# Patient Record
Sex: Female | Born: 1985 | Race: Asian | Hispanic: No | Marital: Married | Smoking: Never smoker
Health system: Southern US, Community
[De-identification: ages and names within clinical notes are randomized; demographics above are authoritative.]

---

## 2016-01-14 ENCOUNTER — Ambulatory Visit
Admission: RE | Admit: 2016-01-14 | Discharge: 2016-01-14 | Disposition: A | Discharge status: Home / Self Care | Payer: Self-pay | Source: Ambulatory Visit | Attending: General Practice | Admitting: General Practice

## 2016-01-14 ENCOUNTER — Ambulatory Visit
Admission: RE | Admit: 2016-01-14 | Discharge: 2016-01-14 | Disposition: A | Discharge status: Home / Self Care | Payer: Self-pay | Source: Ambulatory Visit | Attending: Family Medicine | Admitting: Family Medicine

## 2016-01-14 ENCOUNTER — Other Ambulatory Visit: Payer: Self-pay | Admitting: Family Medicine

## 2016-01-14 DIAGNOSIS — Z111 Encounter for screening for respiratory tuberculosis: Secondary | ICD-10-CM

## 2016-01-14 DIAGNOSIS — R7611 Nonspecific reaction to tuberculin skin test without active tuberculosis: Secondary | ICD-10-CM | POA: Insufficient documentation

## 2016-12-07 IMAGING — CR DG CHEST 1V
1 series · 1 of 1 positions shown · non-contrast
Comparison: None.

CLINICAL DATA: Pt had a positive TB test. No smoker, no previous hx

EXAM:
CHEST  1 VIEW

[dg chest 1 view]
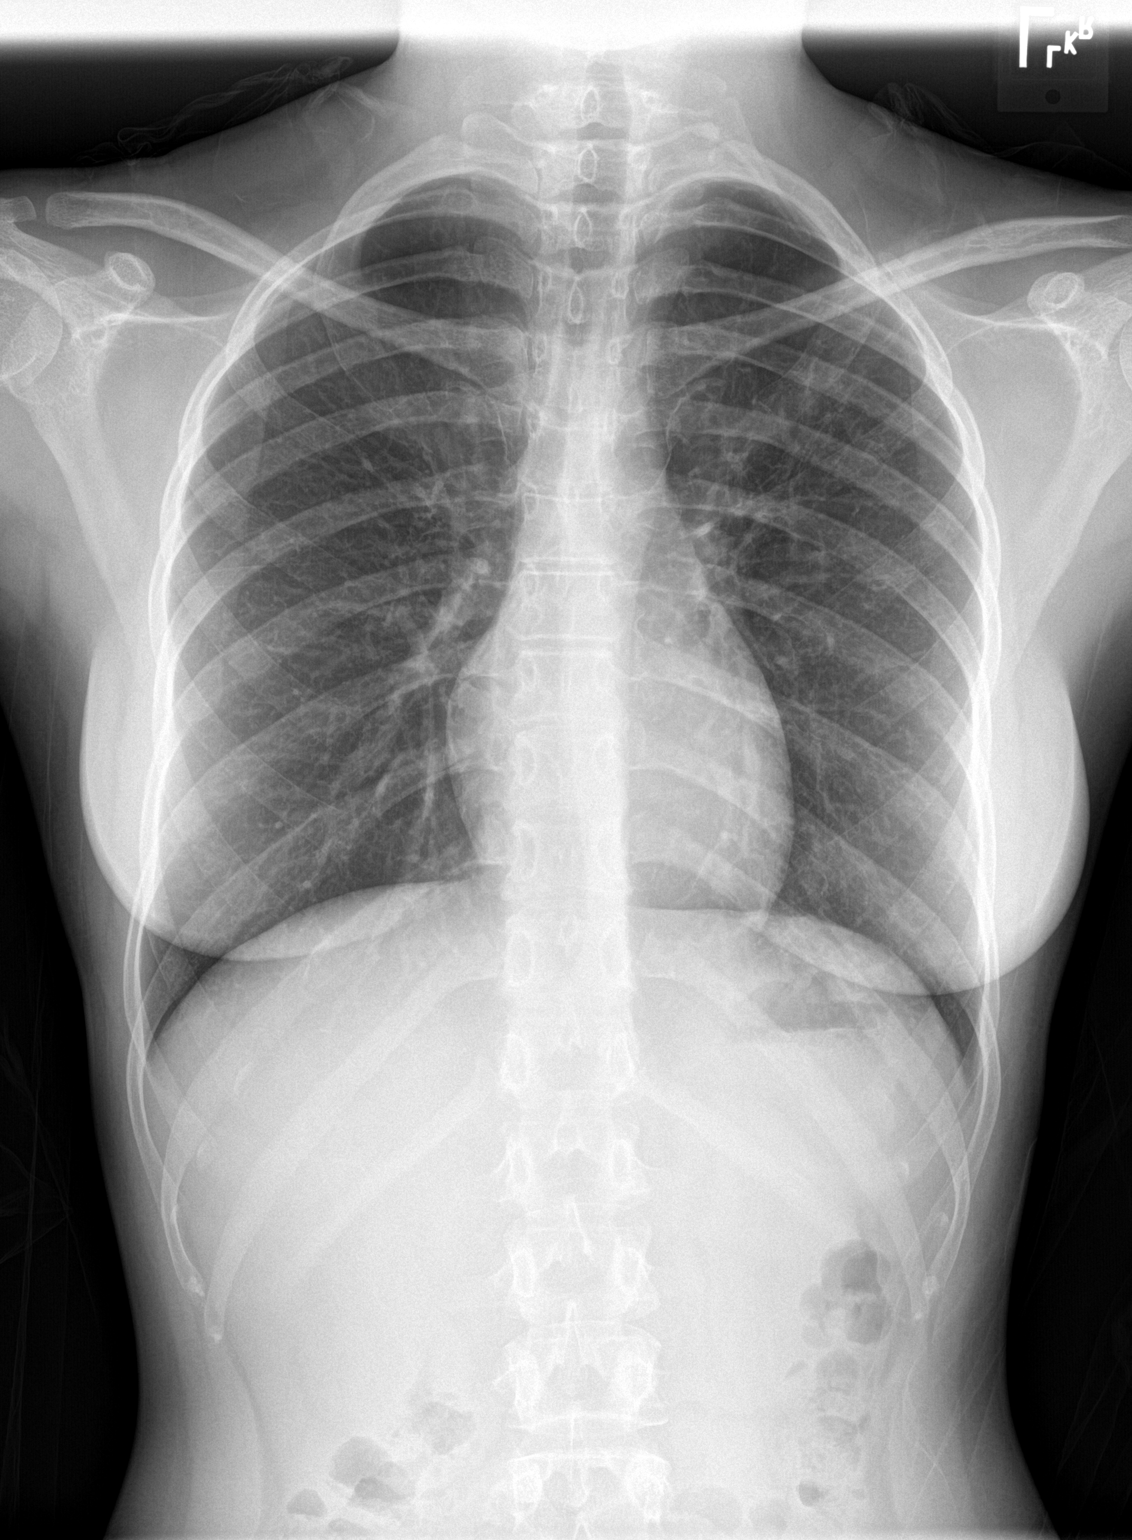

[1 of 1 positions shown; findings below may reference images not displayed]

FINDINGS: Lungs are clear.

Heart size and mediastinal contours are within normal limits.

No effusion.

Visualized bones unremarkable.
IMPRESSION: No acute cardiopulmonary disease.

## 2017-03-22 ENCOUNTER — Ambulatory Visit (INDEPENDENT_AMBULATORY_CARE_PROVIDER_SITE_OTHER): Payer: Self-pay | Admitting: Adult Health

## 2017-03-22 ENCOUNTER — Encounter: Payer: Self-pay | Admitting: Adult Health

## 2017-03-22 VITALS — BP 99/63 | HR 73 | Ht <= 58 in | Wt 90.2 lb

## 2017-03-22 DIAGNOSIS — Z0000 Encounter for general adult medical examination without abnormal findings: Secondary | ICD-10-CM | POA: Insufficient documentation

## 2017-03-22 DIAGNOSIS — J209 Acute bronchitis, unspecified: Secondary | ICD-10-CM

## 2017-03-22 DIAGNOSIS — R5383 Other fatigue: Secondary | ICD-10-CM | POA: Insufficient documentation

## 2017-03-22 MED ORDER — AZITHROMYCIN 250 MG PO TABS
ORAL_TABLET | ORAL | 0 refills | Status: DC
Start: 2017-03-22 — End: 2017-03-28

## 2017-03-22 NOTE — Assessment & Plan Note (Signed)
Azithromycin Increase water/rest/vit c

## 2017-03-22 NOTE — Assessment & Plan Note (Signed)
Will check TSH and Vit D at CPE

## 2017-03-22 NOTE — Assessment & Plan Note (Signed)
Increase water intake to at least 40 ounces/day. Increase regular exercise to at least 12850mins/week-walking, biking, swimming, YouTube/Pinterest [employer]out Videos. CPE with fasting labs in 3 months.

## 2017-03-22 NOTE — Progress Notes (Signed)
Subjective:    Patient ID: Christine Roy, female    DOB: 1986-05-06, 31 y.o.   MRN: [id number]  HPI:  Christine Roy is here to establish as a new pt.  She is a very pleasant 31 year old female.  PMH:  Immigrant from [place] and she has latent TB, CXR 2017-negative for active disease. She denies any other chronic medical conditions/daiy rx medications.  She is married, sexually active, and has never had pelvic exam/PAP smear. She has one current complaint-constant, non-productive cough and moderate amount of clear nasal drainage that has been present >2 weeks. She denies CP/palpitations/fever/night sweats/bloody sputum.  She estimated to drink <15 ounces water/day.  She drinks 2-3 sodas a day and follows a vegetarian diet.  She denies formal exercise and reports excellent sleep 6-9 hrs/night.    Patient Care Team    Relationship Specialty Notifications Start End  Particia Jasper, MD PCP - General Internal Medicine  01/14/16     Patient Active Problem List   Diagnosis Date Noted  . Routine adult health maintenance 03/22/2017  . Other fatigue 03/22/2017  . Bronchitis, acute 03/22/2017     History reviewed. No pertinent past medical history.   History reviewed. No pertinent surgical history.   Family History  Problem Relation Age of Onset  . Healthy Mother   . Healthy Father   . Healthy Brother      History  Drug Use No     History  Alcohol Use  . Yes    Comment: rarely     History  Smoking Status  . Never Smoker  Smokeless Tobacco  . Never Used     Outpatient Encounter Prescriptions as of 03/22/2017  Medication Sig  . azithromycin (ZITHROMAX) 250 MG tablet 2 tabs by mouth day one.  1 tab by mouth days 2-5.   No facility-administered encounter medications on file as of 03/22/2017.     Allergies: Patient has no known allergies.  Body mass index is 19.35 kg/m.  Blood pressure 99/63, pulse 73, height 4' 9.25" (1.454 m), weight 90 lb 3.2 oz (40.9 kg), last menstrual  period 03/20/2017.     Review of Systems  Constitutional: Positive for fatigue. Negative for activity change, appetite change, chills, diaphoresis and fever.  HENT: Positive for congestion, postnasal drip, rhinorrhea and sinus pressure. Negative for sinus pain, sneezing, sore throat, trouble swallowing and voice change.   Eyes: Negative for visual disturbance.  Respiratory: Positive for cough. Negative for chest tightness, shortness of breath, wheezing and stridor.   Cardiovascular: Negative for chest pain, palpitations and leg swelling.  Gastrointestinal: Negative for abdominal distention, abdominal pain, blood in stool, constipation, diarrhea, nausea and vomiting.  Endocrine: Negative for cold intolerance, heat intolerance, polydipsia, polyphagia and polyuria.  Genitourinary: Negative for difficulty urinating, flank pain and hematuria.  Musculoskeletal: Negative for arthralgias, back pain, gait problem, joint swelling, myalgias, neck pain and neck stiffness.  Skin: Negative for color change, pallor, rash and wound.  Neurological: Negative for dizziness, weakness and headaches.  Hematological: Does not bruise/bleed easily.  Psychiatric/Behavioral: Negative for decreased concentration and sleep disturbance.       Objective:   Physical Exam  Constitutional: She is oriented to person, place, and time. She appears well-developed and well-nourished. No distress.  HENT:  Head: Normocephalic and atraumatic.  Right Ear: Hearing, external ear and ear canal normal. Tympanic membrane is bulging. Tympanic membrane is not erythematous. No decreased hearing is noted.  Left Ear: External ear normal. Tympanic membrane is  bulging. Tympanic membrane is not erythematous. No decreased hearing is noted.  Nose: Nose normal. Right sinus exhibits no maxillary sinus tenderness and no frontal sinus tenderness. Left sinus exhibits no maxillary sinus tenderness and no frontal sinus tenderness.  Mouth/Throat:  Uvula is midline, oropharynx is clear and moist and mucous membranes are normal.  Eyes: Pupils are equal, round, and reactive to light. Conjunctivae are normal.  Neck: Normal range of motion. Neck supple.  Cardiovascular: Normal rate, regular rhythm, normal heart sounds and intact distal pulses.   No murmur heard. Pulmonary/Chest: Breath sounds normal. No respiratory distress. She has no wheezes. She has no rales. She exhibits no tenderness.  Constant, dry cough noted during OV  Musculoskeletal: Normal range of motion.  Lymphadenopathy:    She has no cervical adenopathy.  Neurological: She is alert and oriented to person, place, and time. Coordination normal.  Skin: Skin is warm and dry. No rash noted. She is not diaphoretic. No erythema. No pallor.  Psychiatric: She has a normal mood and affect. Her behavior is normal. Judgment and thought content normal.  Nursing note and vitals reviewed.         Assessment & Plan:   1. Routine adult health maintenance   2. Other fatigue   3. Acute bronchitis, unspecified organism     Other fatigue Will check TSH and Vit D at CPE  Routine adult health maintenance Increase water intake to at least 40 ounces/day. Increase regular exercise to at least 16850mins/week-walking, biking, swimming, YouTube/Pinterest [employer] Videos. CPE with fasting labs in 3 months.  Bronchitis, acute Azithromycin Increase water/rest/vit c     FOLLOW-UP:  Return in about 3 months (around 06/22/2017) for CPE, Fasting Lab Draw.

## 2017-03-22 NOTE — Patient Instructions (Signed)
Acute Bronchitis, Adult Acute bronchitis is sudden (acute) swelling of the air tubes (bronchi) in the lungs. Acute bronchitis causes these tubes to fill with mucus, which can make it hard to breathe. It can also cause coughing or wheezing. In adults, acute bronchitis usually goes away within 2 weeks. A cough caused by bronchitis may last up to 3 weeks. Smoking, allergies, and asthma can make the condition worse. Repeated episodes of bronchitis may cause further lung problems, such as chronic obstructive pulmonary disease (COPD). What are the causes? This condition can be caused by germs and by substances that irritate the lungs, including:  Cold and flu viruses. This condition is most often caused by the same virus that causes a cold.  Bacteria.  Exposure to tobacco smoke, dust, fumes, and air pollution.  What increases the risk? This condition is more likely to develop in people who:  Have close contact with someone with acute bronchitis.  Are exposed to lung irritants, such as tobacco smoke, dust, fumes, and vapors.  Have a weak immune system.  Have a respiratory condition such as asthma.  What are the signs or symptoms? Symptoms of this condition include:  A cough.  Coughing up clear, yellow, or green mucus.  Wheezing.  Chest congestion.  Shortness of breath.  A fever.  Body aches.  Chills.  A sore throat.  How is this diagnosed? This condition is usually diagnosed with a physical exam. During the exam, your health care provider may order tests, such as chest X-rays, to rule out other conditions. He or she may also:  Test a sample of your mucus for bacterial infection.  Check the level of oxygen in your blood. This is done to check for pneumonia.  Do a chest X-ray or lung function testing to rule out pneumonia and other conditions.  Perform blood tests.  Your health care provider will also ask about your symptoms and medical history. How is this  treated? Most cases of acute bronchitis clear up over time without treatment. Your health care provider may recommend:  Drinking more fluids. Drinking more makes your mucus thinner, which may make it easier to breathe.  Taking a medicine for a fever or cough.  Taking an antibiotic medicine.  Using an inhaler to help improve shortness of breath and to control a cough.  Using a cool mist vaporizer or humidifier to make it easier to breathe.  Follow these instructions at home: Medicines  Take over-the-counter and prescription medicines only as told by your health care provider.  If you were prescribed an antibiotic, take it as told by your health care provider. Do not stop taking the antibiotic even if you start to feel better. General instructions  Get plenty of rest.  Drink enough fluids to keep your urine clear or pale yellow.  Avoid smoking and secondhand smoke. Exposure to cigarette smoke or irritating chemicals will make bronchitis worse. If you smoke and you need help quitting, ask your health care provider. Quitting smoking will help your lungs heal faster.  Use an inhaler, cool mist vaporizer, or humidifier as told by your health care provider.  Keep all follow-up visits as told by your health care provider. This is important. How is this prevented? To lower your risk of getting this condition again:  Wash your hands often with soap and water. If soap and water are not available, use hand sanitizer.  Avoid contact with people who have cold symptoms.  Try not to touch your hands to your   mouth, nose, or eyes.  Make sure to get the flu shot every year.  Contact a health care provider if:  Your symptoms do not improve in 2 weeks of treatment. Get help right away if:  You cough up blood.  You have chest pain.  You have severe shortness of breath.  You become dehydrated.  You faint or keep feeling like you are going to faint.  You keep vomiting.  You have a  severe headache.  Your fever or chills gets worse. This information is not intended to replace advice given to you by your health care provider. Make sure you discuss any questions you have with your health care provider. Document Released: 09/02/2004 Document Revised: 02/18/2016 Document Reviewed: 01/14/2016 Elsevier Interactive Patient Education  2017 Elsevier Inc.   Heart-Healthy Eating Plan Many factors influence your heart health, including eating and exercise habits. Heart (coronary) risk increases with abnormal blood fat (lipid) levels. Heart-healthy meal planning includes limiting unhealthy fats, increasing healthy fats, and making other small dietary changes. This includes maintaining a healthy body weight to help keep lipid levels within a normal range. What is my plan? Your health care provider recommends that you:  Get no more than _25_% of the total calories in your daily diet from fat.  Limit your intake of saturated fat to less than _5_% of your total calories each day  What types of fat should I choose?  Choose healthy fats more often. Choose monounsaturated and polyunsaturated fats, such as olive oil and canola oil, flaxseeds, walnuts, almonds, and seeds.  Eat more omega-3 fats. Good choices include salmon, mackerel, sardines, tuna, flaxseed oil, and ground flaxseeds. Aim to eat fish at least two times each week.  Limit saturated fats. Saturated fats are primarily found in animal products, such as meats, butter, and cream. Plant sources of saturated fats include palm oil, palm kernel oil, and coconut oil.  Avoid foods with partially hydrogenated oils in them. These contain trans fats. Examples of foods that contain trans fats are stick margarine, some tub margarines, cookies, crackers, and other baked goods. What general guidelines do I need to follow?  Check food labels carefully to identify foods with trans fats or high amounts of saturated fat.  Fill one half of  your plate with vegetables and green salads. Eat 4-5 servings of vegetables per day. A serving of vegetables equals 1 cup of raw leafy vegetables,  cup of raw or cooked cut-up vegetables, or  cup of vegetable juice.  Fill one fourth of your plate with whole grains. Look for the word "whole" as the first word in the ingredient list.  Fill one fourth of your plate with lean protein foods.  Eat 4-5 servings of fruit per day. A serving of fruit equals one medium whole fruit,  cup of dried fruit,  cup of fresh, frozen, or canned fruit, or  cup of 100% fruit juice.  Eat more foods that contain soluble fiber. Examples of foods that contain this type of fiber are apples, broccoli, carrots, beans, peas, and barley. Aim to get 20-30 g of fiber per day.  Eat more home-cooked food and less restaurant, buffet, and fast food.  Limit or avoid alcohol.  Limit foods that are high in starch and sugar.  Avoid fried foods.  Cook foods by using methods other than frying. Baking, boiling, grilling, and broiling are all great options. Other fat-reducing suggestions include: ? Removing the skin from poultry. ? Removing all visible fats from meats. ?  Skimming the fat off of stews, soups, and gravies before serving them. ? Steaming vegetables in water or broth.  Lose weight if you are overweight. Losing just 5-10% of your initial body weight can help your overall health and prevent diseases such as diabetes and heart disease.  Increase your consumption of nuts, legumes, and seeds to 4-5 servings per week. One serving of dried beans or legumes equals  cup after being cooked, one serving of nuts equals 1 ounces, and one serving of seeds equals  ounce or 1 tablespoon.  You may need to monitor your salt (sodium) intake, especially if you have high blood pressure. Talk with your health care provider or dietitian to get more information about reducing sodium. What foods can I eat? Grains  Breads, including  [place], white, pita, wheat, raisin, rye, oatmeal, and [place]. Tortillas that are neither fried nor made with lard or trans fat. Low-fat rolls, including hotdog and hamburger buns and English muffins. Biscuits. Muffins. Waffles. Pancakes. Light popcorn. Whole-grain cereals. Flatbread. Melba toast. Pretzels. Breadsticks. Rusks. Low-fat snacks and crackers, including oyster, saltine, matzo, graham, animal, and rye. Rice and pasta, including brown rice and those that are made with whole wheat. Vegetables All vegetables. Fruits All fruits, but limit coconut. Meats and Other Protein Sources Lean, well-trimmed beef, veal, pork, and lamb. Chicken and [place] without skin. All fish and shellfish. Wild duck, rabbit, pheasant, and venison. Egg whites or low-cholesterol egg substitutes. Dried beans, peas, lentils, and tofu.Seeds and most nuts. Dairy Low-fat or nonfat cheeses, including ricotta, string, and mozzarella. Skim or 1% milk that is liquid, powdered, or evaporated. Buttermilk that is made with low-fat milk. Nonfat or low-fat yogurt. Beverages Mineral water. Diet carbonated beverages. Sweets and Desserts Sherbets and fruit ices. Honey, jam, marmalade, jelly, and syrups. Meringues and gelatins. Pure sugar candy, such as hard candy, jelly beans, gumdrops, mints, marshmallows, and small amounts of dark chocolate. MGM MIRAGEngel food cake. Eat all sweets and desserts in moderation. Fats and Oils Nonhydrogenated (trans-free) margarines. Vegetable oils, including soybean, sesame, sunflower, olive, peanut, safflower, corn, canola, and cottonseed. Salad dressings or mayonnaise that are made with a vegetable oil. Limit added fats and oils that you use for cooking, baking, salads, and as spreads. Other Cocoa powder. Coffee and tea. All seasonings and condiments. The items listed above may not be a complete list of recommended foods or beverages. Contact your dietitian for more options. What foods are not  recommended? Grains Breads that are made with saturated or trans fats, oils, or whole milk. Croissants. Butter rolls. Cheese breads. Sweet rolls. Donuts. Buttered popcorn. Chow mein noodles. High-fat crackers, such as cheese or butter crackers. Meats and Other Protein Sources Fatty meats, such as hotdogs, short ribs, sausage, spareribs, bacon, ribeye roast or steak, and mutton. High-fat deli meats, such as salami and bologna. Caviar. Domestic duck and goose. Organ meats, such as kidney, liver, sweetbreads, brains, gizzard, chitterlings, and heart. Dairy Cream, sour cream, cream cheese, and creamed cottage cheese. Whole milk cheeses, including blue (bleu), [address] [place], [place], [place], [address], [place], [address], [place]dar, [place], and [place]. Whole or 2% milk that is liquid, evaporated, or condensed. Whole buttermilk. Cream sauce or high-fat cheese sauce. Yogurt that is made from whole milk. Beverages Regular sodas and drinks with added sugar. Sweets and Desserts Frosting. Pudding. Cookies. Cakes other than angel food cake. Candy that has milk chocolate or white chocolate, hydrogenated fat, butter, coconut, or unknown ingredients. Buttered syrups. Full-fat ice cream or ice cream drinks. Fats and Oils  Gravy that has suet, meat fat, or shortening. Cocoa butter, hydrogenated oils, palm oil, coconut oil, palm kernel oil. These can often be found in baked products, candy, fried foods, nondairy creamers, and whipped toppings. Solid fats and shortenings, including bacon fat, salt pork, lard, and butter. Nondairy cream substitutes, such as coffee creamers and sour cream substitutes. Salad dressings that are made of unknown oils, cheese, or sour cream. The items listed above may not be a complete list of foods and beverages to avoid. Contact your dietitian for more information. This information is not intended to replace advice given to you by your health care provider. Make sure you discuss any questions you  have with your health care provider. Document Released: 05/04/2008 Document Revised: 02/13/2016 Document Reviewed: 01/17/2014 Elsevier Interactive Patient Education  2017 Elsevier Inc.   Increase eater intake to at least 40 ounces/day. Take Azithromycin as directed. If cough continues after antibiotic completed, then please call clinic. Please schedule complete physical with fasting labs in 3 months. WELCOME TO THE PRACTICE!

## 2017-03-24 ENCOUNTER — Ambulatory Visit: Payer: Self-pay | Admitting: Adult Health

## 2017-03-28 ENCOUNTER — Ambulatory Visit: Payer: Self-pay

## 2017-03-28 ENCOUNTER — Encounter: Payer: Self-pay | Admitting: Adult Health

## 2017-03-28 ENCOUNTER — Ambulatory Visit (INDEPENDENT_AMBULATORY_CARE_PROVIDER_SITE_OTHER): Payer: Self-pay | Admitting: Adult Health

## 2017-03-28 VITALS — BP 103/66 | HR 88 | Temp 99.0°F | Ht <= 58 in | Wt 89.9 lb

## 2017-03-28 DIAGNOSIS — R7611 Nonspecific reaction to tuberculin skin test without active tuberculosis: Secondary | ICD-10-CM

## 2017-03-28 DIAGNOSIS — Z9289 Personal history of other medical treatment: Secondary | ICD-10-CM

## 2017-03-28 DIAGNOSIS — R05 Cough: Secondary | ICD-10-CM

## 2017-03-28 DIAGNOSIS — R053 Chronic cough: Secondary | ICD-10-CM

## 2017-03-28 DIAGNOSIS — J209 Acute bronchitis, unspecified: Secondary | ICD-10-CM

## 2017-03-28 MED ORDER — PREDNISONE 10 MG PO TABS: 10.0000 mg | ORAL_TABLET | Freq: Two times a day (BID) | ORAL | 0 refills | Status: AC

## 2017-03-28 NOTE — Progress Notes (Signed)
Please call Christine Roy and share that her CXR:  Negative for acute cardiopulmonary process.

## 2017-03-28 NOTE — Patient Instructions (Signed)

## 2017-03-28 NOTE — Progress Notes (Signed)
Subjective:    Patient ID: Christine Roy, female    DOB: September 25, 1985, 31 y.o.   MRN: [id number]  HPI:  Christine Roy is here for continued non-productive cough that now has been occurring >2.5 weeks. Last week she was started on Zpack, which she stated that completed and sx's did not improve.  She continues to have constant, dry cough.  She denies fever/night sweats/N/V/D/poor appetite.  She denies CP/dyspnea at rest/palpitations.   Of Note:   Immigrant from [place] and she has latent TB, CXR 2017-negative for active disease.  Patient Care Team    Relationship Specialty Notifications Start End  Julaine Fusi, NP PCP - General Family Medicine  03/28/17     Patient Active Problem List   Diagnosis Date Noted  . Routine adult health maintenance 03/22/2017  . Other fatigue 03/22/2017  . Bronchitis, acute 03/22/2017     History reviewed. No pertinent past medical history.   History reviewed. No pertinent surgical history.   Family History  Problem Relation Age of Onset  . Healthy Mother   . Healthy Father   . Healthy Brother      History  Drug Use No     History  Alcohol Use  . Yes    Comment: rarely     History  Smoking Status  . Never Smoker  Smokeless Tobacco  . Never Used     Outpatient Encounter Prescriptions as of 03/28/2017  Medication Sig  . predniSONE (DELTASONE) 10 MG tablet Take 1 tablet (10 mg total) by mouth 2 (two) times daily.  . [DISCONTINUED] azithromycin (ZITHROMAX) 250 MG tablet 2 tabs by mouth day one.  1 tab by mouth days 2-5.   No facility-administered encounter medications on file as of 03/28/2017.     Allergies: Patient has no known allergies.  Body mass index is 19.28 kg/m.  Blood pressure 103/66, pulse 88, temperature 99 F (37.2 C), temperature source Oral, height 4' 9.25" (1.454 m), weight 89 lb 14.4 oz (40.8 kg), last menstrual period 03/20/2017, SpO2 99 %.    Review of Systems  Constitutional: Negative for activity change,  appetite change, chills, diaphoresis, fatigue, fever and unexpected weight change.  HENT: Negative for congestion, postnasal drip, rhinorrhea and sore throat.   Eyes: Negative for visual disturbance.  Respiratory: Positive for cough. Negative for chest tightness, shortness of breath, wheezing and stridor.   Cardiovascular: Negative for chest pain, palpitations and leg swelling.  Neurological: Negative for dizziness and headaches.  Psychiatric/Behavioral: Negative for sleep disturbance.       Objective:   Physical Exam  Constitutional: She is oriented to person, place, and time. She appears well-developed and well-nourished. No distress.  HENT:  Head: Normocephalic and atraumatic.  Right Ear: Hearing, tympanic membrane, external ear and ear canal normal. Tympanic membrane is not erythematous and not bulging. No decreased hearing is noted.  Left Ear: Hearing, tympanic membrane, external ear and ear canal normal. Tympanic membrane is not erythematous and not bulging. No decreased hearing is noted.  Nose: Nose normal. Right sinus exhibits no maxillary sinus tenderness and no frontal sinus tenderness. Left sinus exhibits no maxillary sinus tenderness and no frontal sinus tenderness.  Mouth/Throat: Uvula is midline, oropharynx is clear and moist and mucous membranes are normal.  Cardiovascular: Normal rate, regular rhythm, normal heart sounds and intact distal pulses.   No murmur heard. Pulmonary/Chest: Breath sounds normal. No respiratory distress. She has no wheezes. She has no rales. She exhibits no tenderness.  Neurological: She  is alert and oriented to person, place, and time. Coordination normal.  Skin: Skin is warm and dry. No rash noted. She is not diaphoretic. No erythema. No pallor.  Psychiatric: She has a normal mood and affect. Her behavior is normal. Judgment and thought content normal.  Nursing note and vitals reviewed.         Assessment & Plan:   1. Cough, persistent   2.  History of positive PPD   3. Acute bronchitis, unspecified organism     Bronchitis, acute She completed Azithromycin course and sx's did not improve. CXR completed in office-negative for acute cardiopulmonary process. Prednisone 10mg  BID x 5 days Increase water. If sx's persist after prednisone completed, then please call clinic.    FOLLOW-UP:  Return if symptoms worsen or fail to improve.

## 2017-03-28 NOTE — Assessment & Plan Note (Addendum)
She completed Azithromycin course and sx's did not improve. CXR completed in office-negative for acute cardiopulmonary process. Prednisone 10mg  BID x 5 days Increase water. If sx's persist after prednisone completed, then please call clinic.

## 2017-06-20 ENCOUNTER — Other Ambulatory Visit: Payer: Self-pay

## 2018-03-14 ENCOUNTER — Ambulatory Visit
Admission: RE | Admit: 2018-03-14 | Discharge: 2018-03-14 | Disposition: A | Discharge status: Home / Self Care | Payer: Self-pay | Source: Ambulatory Visit | Attending: Family Medicine | Admitting: Family Medicine

## 2018-03-14 ENCOUNTER — Other Ambulatory Visit (HOSPITAL_COMMUNITY): Payer: Self-pay | Admitting: Family Medicine

## 2018-03-14 DIAGNOSIS — R7611 Nonspecific reaction to tuberculin skin test without active tuberculosis: Secondary | ICD-10-CM

## 2019-02-05 IMAGING — CR DG CHEST 1V
1 series · 1 of 1 positions shown · non-contrast
Comparison: 03/28/2017, 01/14/2016

CLINICAL DATA: Positive PPD, asymptomatic

EXAM:
CHEST  1 VIEW

[dg chest 1 view]
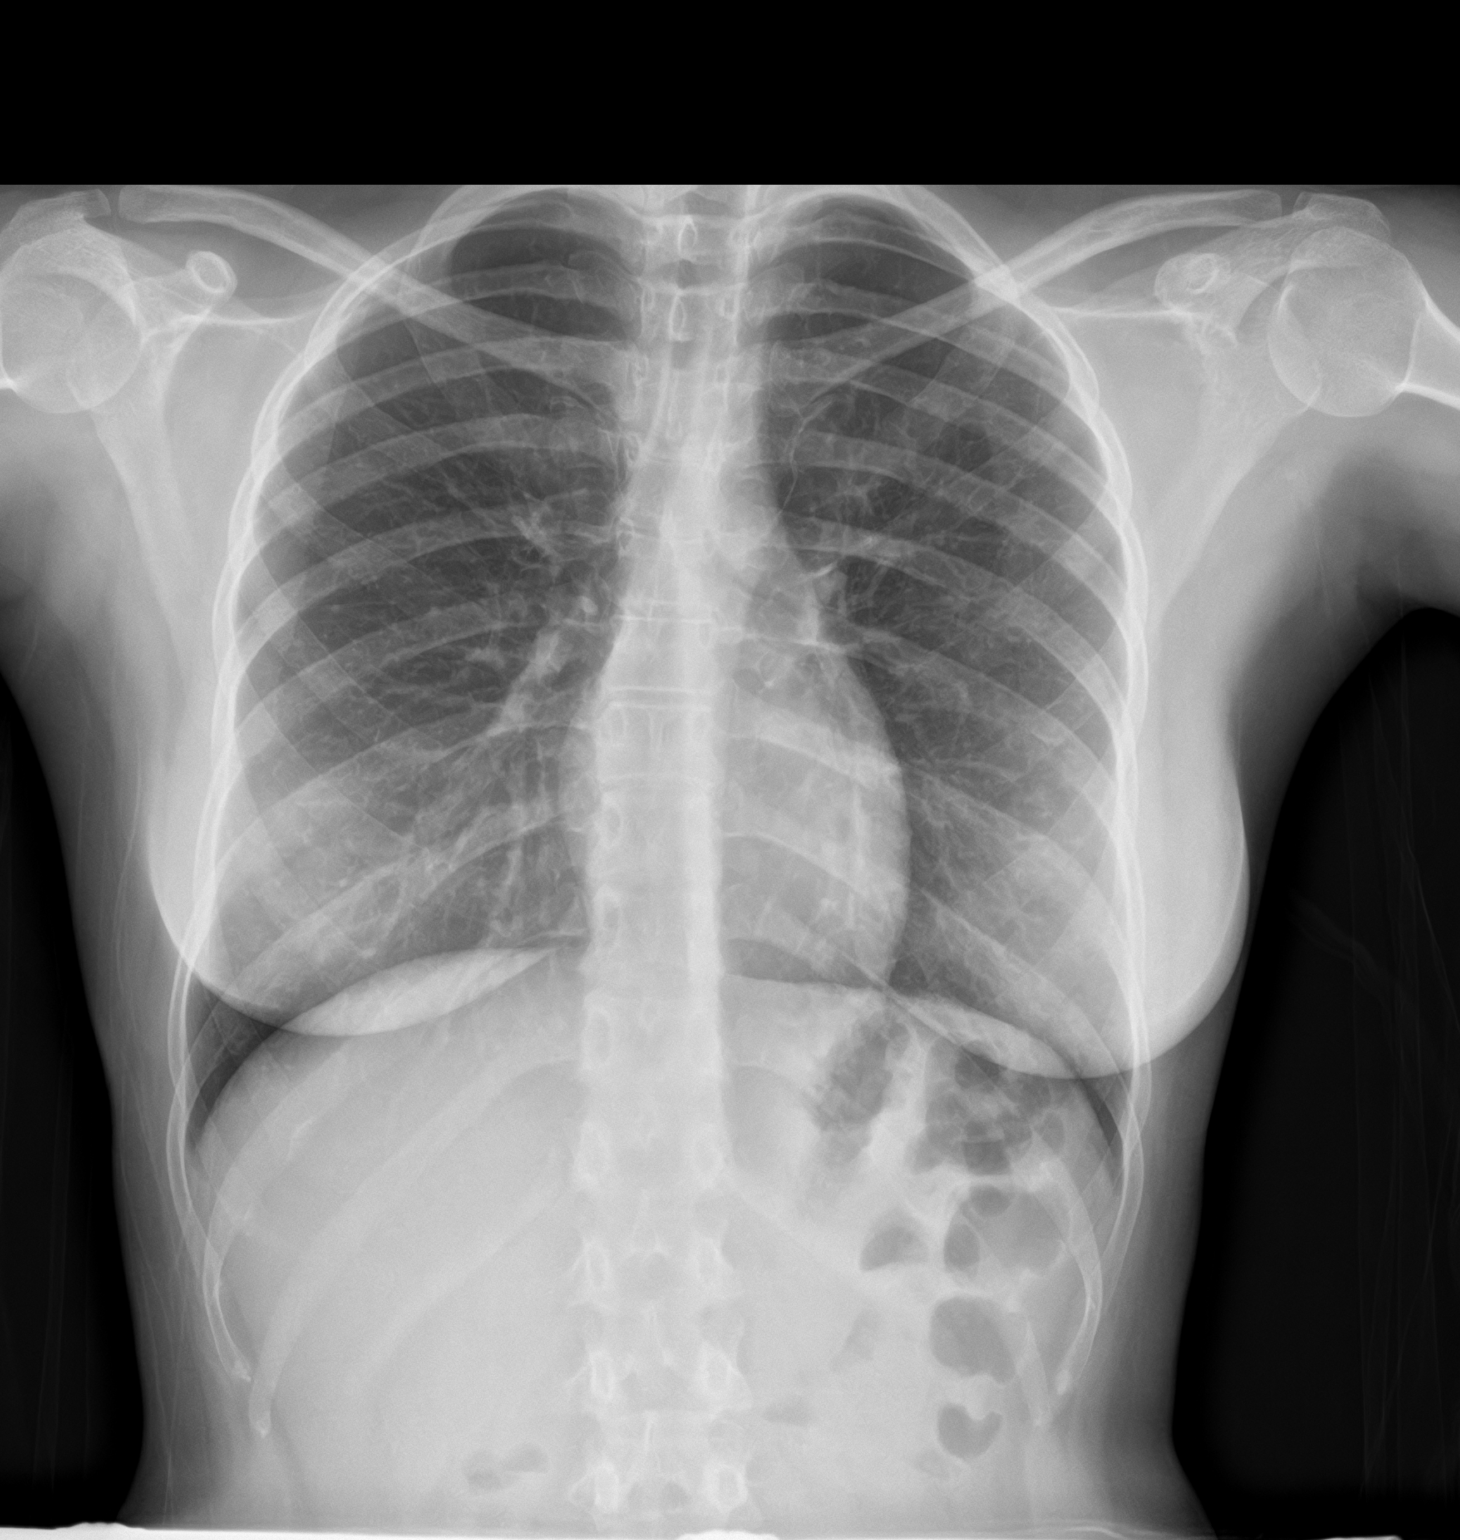

[1 of 1 positions shown; findings below may reference images not displayed]

FINDINGS: Normal heart size, mediastinal contours, and pulmonary vascularity.

Minimal linear scarring LEFT upper lobe.

Lungs otherwise clear.

No infiltrate, pleural effusion, or pneumothorax.

LEFT cervical rib.

Bones otherwise unremarkable.
IMPRESSION: Minimal linear LEFT upper lobe scarring, stable.

No acute abnormalities.
